# Patient Record
Sex: Male | Born: 2000 | Race: White | Hispanic: No | Marital: Single | Smoking: Never smoker
Health system: Southern US, Community
[De-identification: ages and names within clinical notes are randomized; demographics above are authoritative.]

## PROBLEM LIST (undated history)

## (undated) DIAGNOSIS — Z87820 Personal history of traumatic brain injury: Secondary | ICD-10-CM

## (undated) HISTORY — DX: Personal history of traumatic brain injury: Z87.820

## (undated) HISTORY — PX: OTHER SURGICAL HISTORY: SHX169

---
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
## 2021-04-14 ENCOUNTER — Emergency Department (HOSPITAL_COMMUNITY)
Admission: EM | Admit: 2021-04-14 | Discharge: 2021-04-15 | Disposition: A | Discharge status: Home / Self Care | Payer: 59 | Attending: Emergency Medicine | Admitting: Emergency Medicine

## 2021-04-14 ENCOUNTER — Other Ambulatory Visit: Payer: Self-pay

## 2021-04-14 ENCOUNTER — Encounter (HOSPITAL_COMMUNITY): Payer: Self-pay | Admitting: Emergency Medicine

## 2021-04-14 DIAGNOSIS — S50311A Abrasion of right elbow, initial encounter: Secondary | ICD-10-CM | POA: Insufficient documentation

## 2021-04-14 DIAGNOSIS — S0990XA Unspecified injury of head, initial encounter: Secondary | ICD-10-CM | POA: Diagnosis present

## 2021-04-14 DIAGNOSIS — S060X9A Concussion with loss of consciousness of unspecified duration, initial encounter: Secondary | ICD-10-CM

## 2021-04-14 DIAGNOSIS — Y92410 Unspecified street and highway as the place of occurrence of the external cause: Secondary | ICD-10-CM | POA: Insufficient documentation

## 2021-04-14 DIAGNOSIS — S0083XA Contusion of other part of head, initial encounter: Secondary | ICD-10-CM | POA: Diagnosis not present

## 2021-04-14 DIAGNOSIS — Z23 Encounter for immunization: Secondary | ICD-10-CM | POA: Diagnosis not present

## 2021-04-14 DIAGNOSIS — S50312A Abrasion of left elbow, initial encounter: Secondary | ICD-10-CM | POA: Insufficient documentation

## 2021-04-14 DIAGNOSIS — S80212A Abrasion, left knee, initial encounter: Secondary | ICD-10-CM | POA: Insufficient documentation

## 2021-04-14 DIAGNOSIS — S80211A Abrasion, right knee, initial encounter: Secondary | ICD-10-CM | POA: Insufficient documentation

## 2021-04-14 DIAGNOSIS — M545 Low back pain, unspecified: Secondary | ICD-10-CM | POA: Diagnosis not present
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
## 2021-04-15 ENCOUNTER — Emergency Department (HOSPITAL_COMMUNITY): Payer: 59

## 2021-04-15 ENCOUNTER — Telehealth: Payer: Self-pay | Admitting: *Deleted

## 2021-04-15 LAB — COMPREHENSIVE METABOLIC PANEL
ALT: 18 U/L (ref 0–44)
AST: 26 U/L (ref 15–41)
Albumin: 4.4 g/dL (ref 3.5–5.0)
Alkaline Phosphatase: 63 U/L (ref 38–126)
Anion gap: 8 (ref 5–15)
BUN: 7 mg/dL (ref 6–20)
CO2: 23 mmol/L (ref 22–32)
Calcium: 9 mg/dL (ref 8.9–10.3)
Chloride: 105 mmol/L (ref 98–111)
Creatinine, Ser: 0.98 mg/dL (ref 0.61–1.24)
GFR, Estimated: >60 mL/min (ref >60)
Glucose, Bld: 113 mg/dL — ABNORMAL HIGH (ref 70–99)
Potassium: 3.2 mmol/L — ABNORMAL LOW (ref 3.5–5.1)
Sodium: 136 mmol/L (ref 135–145)
Total Bilirubin: 1.3 mg/dL — ABNORMAL HIGH (ref 0.3–1.2)
Total Protein: 7.3 g/dL (ref 6.5–8.1)

## 2021-04-15 LAB — URINALYSIS, ROUTINE W REFLEX MICROSCOPIC
Bacteria, UA: NONE SEEN
Bilirubin Urine: NEGATIVE
Glucose, UA: NEGATIVE mg/dL
Ketones, ur: NEGATIVE mg/dL
Leukocytes,Ua: NEGATIVE
Nitrite: NEGATIVE
Protein, ur: NEGATIVE mg/dL
Specific Gravity, Urine: 1.006 (ref 1.005–1.030)
pH: 6 (ref 5.0–8.0)

## 2021-04-15 LAB — CBC WITH DIFFERENTIAL/PLATELET
Abs Immature Granulocytes: 0.08 10*3/uL — ABNORMAL HIGH (ref 0.00–0.07)
Basophils Absolute: 0.1 10*3/uL (ref 0.0–0.1)
Basophils Relative: 0 %
Eosinophils Absolute: 0 10*3/uL (ref 0.0–0.5)
Eosinophils Relative: 0 %
HCT: 40.6 % (ref 39.0–52.0)
Hemoglobin: 14.5 g/dL (ref 13.0–17.0)
Immature Granulocytes: 1 %
Lymphocytes Relative: 9 %
Lymphs Abs: 1.5 10*3/uL (ref 0.7–4.0)
MCH: 31.3 pg (ref 26.0–34.0)
MCHC: 35.7 g/dL (ref 30.0–36.0)
MCV: 87.5 fL (ref 80.0–100.0)
Monocytes Absolute: 1.1 10*3/uL — ABNORMAL HIGH (ref 0.1–1.0)
Monocytes Relative: 6 %
Neutro Abs: 14.6 10*3/uL — ABNORMAL HIGH (ref 1.7–7.7)
Neutrophils Relative %: 84 %
Platelets: 238 10*3/uL (ref 150–400)
RBC: 4.64 MIL/uL (ref 4.22–5.81)
RDW: 11.8 % (ref 11.5–15.5)
WBC: 17.3 10*3/uL — ABNORMAL HIGH (ref 4.0–10.5)
nRBC: 0 % (ref 0.0–0.2)

## 2021-04-15 LAB — RAPID URINE DRUG SCREEN, HOSP PERFORMED
Amphetamines: NOT DETECTED
Barbiturates: NOT DETECTED
Benzodiazepines: NOT DETECTED
Cocaine: NOT DETECTED
Opiates: NOT DETECTED
Tetrahydrocannabinol: NOT DETECTED

## 2021-04-15 LAB — ETHANOL: Alcohol, Ethyl (B): <10 mg/dL (ref <10)

## 2021-04-15 MED ORDER — FENTANYL CITRATE (PF) 100 MCG/2ML IJ SOLN
50.0000 ug | Freq: Once | INTRAMUSCULAR | Status: AC
  Administered 2021-04-15: 50 ug via INTRAVENOUS
  Filled 2021-04-15: qty 2

## 2021-04-15 MED ORDER — KETOROLAC TROMETHAMINE 30 MG/ML IJ SOLN
30.0000 mg | Freq: Once | INTRAMUSCULAR | Status: AC
  Administered 2021-04-15: 30 mg via INTRAVENOUS
  Filled 2021-04-15: qty 1

## 2021-04-15 MED ORDER — ACETAMINOPHEN 500 MG PO TABS
1000.0000 mg | ORAL_TABLET | Freq: Once | ORAL | Status: AC
  Administered 2021-04-15: 1000 mg via ORAL
  Filled 2021-04-15: qty 2

## 2021-04-15 MED ORDER — DIPHENHYDRAMINE HCL 50 MG/ML IJ SOLN
25.0000 mg | Freq: Once | INTRAMUSCULAR | Status: AC
  Administered 2021-04-15: 25 mg via INTRAVENOUS
  Filled 2021-04-15: qty 1

## 2021-04-15 MED ORDER — SODIUM CHLORIDE 0.9 % IV BOLUS
1000.0000 mL | Freq: Once | INTRAVENOUS | Status: AC
  Administered 2021-04-15: 1000 mL via INTRAVENOUS

## 2021-04-15 MED ORDER — ONDANSETRON HCL 4 MG/2ML IJ SOLN
4.0000 mg | Freq: Once | INTRAMUSCULAR | Status: AC
  Administered 2021-04-15: 4 mg via INTRAVENOUS
  Filled 2021-04-15: qty 2

## 2021-04-15 MED ORDER — TETANUS-DIPHTH-ACELL PERTUSSIS 5-2.5-18.5 LF-MCG/0.5 IM SUSY
0.5000 mL | PREFILLED_SYRINGE | Freq: Once | INTRAMUSCULAR | Status: AC
  Administered 2021-04-15: 0.5 mL via INTRAMUSCULAR
  Filled 2021-04-15: qty 0.5

## 2021-04-15 MED ORDER — METOCLOPRAMIDE HCL 5 MG/ML IJ SOLN
10.0000 mg | Freq: Once | INTRAMUSCULAR | Status: AC
  Administered 2021-04-15: 10 mg via INTRAVENOUS
  Filled 2021-04-15: qty 2

## 2021-04-15 MED ORDER — BACITRACIN ZINC 500 UNIT/GM EX OINT: 1.0000 "application " | TOPICAL_OINTMENT | Freq: Two times a day (BID) | CUTANEOUS | 0 refills | Status: AC
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
## 2021-04-15 NOTE — Telephone Encounter (Signed)
Pt mom called regarding discharge instructions.  Mom states she was told paperwork would be placed in his patient belongings bag, but it was not (she was instructed to retrieve the car).  RNCM reprinted and placed at front desk for pickup.

## 2021-04-15 NOTE — ED Notes (Signed)
Pt given water and graham crackers, tolerated well

## 2021-04-15 NOTE — Discharge Instructions (Addendum)
1. Medications: Ibuprofen or Tylenol for pain - Recommend 400mg  of Ibuprofen and 500mg  of tylenol at breakfast, lunch and dinner. 2. Treatment: Rest, ice on head.  Concussion precautions given - keep patient in a quiet, not simulating, dark environment. No TV, computer use, video games until headache is resolved completely. No contact sports until cleared by the neurology. 3. Follow Up: With primary care physician on Monday if headache persists.  Return to the emergency department if patient becomes lethargic, begins vomiting, develops double vision, speech difficulty, problems walking or other change in mental status.

## 2021-04-15 NOTE — ED Notes (Signed)
Patient transported to CT 

## 2021-04-18 ENCOUNTER — Encounter: Payer: Self-pay | Admitting: Neurology

## 2021-04-18 ENCOUNTER — Ambulatory Visit (INDEPENDENT_AMBULATORY_CARE_PROVIDER_SITE_OTHER): Payer: 59 | Admitting: Neurology

## 2021-04-18 ENCOUNTER — Encounter: Payer: Self-pay | Admitting: *Deleted

## 2021-04-18 VITALS — BP 118/62 | HR 68 | Ht 71.0 in | Wt 172.0 lb

## 2021-04-18 DIAGNOSIS — S060X1A Concussion with loss of consciousness of 30 minutes or less, initial encounter: Secondary | ICD-10-CM | POA: Insufficient documentation

## 2021-04-18 DIAGNOSIS — R519 Headache, unspecified: Secondary | ICD-10-CM

## 2021-04-18 DIAGNOSIS — S060X1D Concussion with loss of consciousness of 30 minutes or less, subsequent encounter: Secondary | ICD-10-CM

## 2021-04-18 MED ORDER — NORTRIPTYLINE HCL 10 MG PO CAPS: 30.0000 mg | ORAL_CAPSULE | Freq: Every day | ORAL | 11 refills | Status: DC
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
## 2021-04-19 LAB — CBC WITH DIFFERENTIAL/PLATELET
Basophils Absolute: 0.1 10*3/uL (ref 0.0–0.2)
Basos: 1 %
EOS (ABSOLUTE): 0.2 10*3/uL (ref 0.0–0.4)
Eos: 3 %
Hematocrit: 41 % (ref 37.5–51.0)
Hemoglobin: 14.1 g/dL (ref 13.0–17.7)
Immature Grans (Abs): 0 10*3/uL (ref 0.0–0.1)
Immature Granulocytes: 0 %
Lymphocytes Absolute: 1.2 10*3/uL (ref 0.7–3.1)
Lymphs: 20 %
MCH: 31.3 pg (ref 26.6–33.0)
MCHC: 34.4 g/dL (ref 31.5–35.7)
MCV: 91 fL (ref 79–97)
Monocytes Absolute: 0.5 10*3/uL (ref 0.1–0.9)
Monocytes: 8 %
Neutrophils Absolute: 4.1 10*3/uL (ref 1.4–7.0)
Neutrophils: 68 %
Platelets: 244 10*3/uL (ref 150–450)
RBC: 4.51 x10E6/uL (ref 4.14–5.80)
RDW: 12.1 % (ref 11.6–15.4)
WBC: 6.1 10*3/uL (ref 3.4–10.8)

## 2021-04-19 LAB — HGB A1C W/O EAG: Hgb A1c MFr Bld: 4.9 % (ref 4.8–5.6)

## 2021-04-19 LAB — VITAMIN B12: Vitamin B-12: 349 pg/mL (ref 232–1245)

## 2021-04-19 LAB — CK: Total CK: 140 U/L (ref 49–439)

## 2021-04-19 LAB — TSH: TSH: 1.12 u[IU]/mL (ref 0.450–4.500)

## 2021-04-19 LAB — PROLACTIN: Prolactin: 5.9 ng/mL (ref 4.0–15.2)

## 2021-04-23 ENCOUNTER — Telehealth: Payer: Self-pay | Admitting: Neurology

## 2021-04-23 ENCOUNTER — Telehealth: Payer: Self-pay | Admitting: *Deleted

## 2021-04-23 NOTE — Telephone Encounter (Addendum)
MRI brain w/wo contrast [employer] auth: [id number] (exp/ 06/07/21)  Scheduled at [facility] 04/24/21 at 9:30 am

## 2021-04-23 NOTE — Telephone Encounter (Signed)
I spoke to the patient's mother on DPR. She was notified of his lab results. She understands to expect a call to get his MRI brain and EEG scheduled. Also, cardiology will be reaching out to make an appt.

## 2021-04-23 NOTE — Telephone Encounter (Signed)
Adam Shook, RN  Brennan Bailey I spoke to patient's mother on [place]. She was inquiring about his MRI brain, EEG and cardiology referral. I informed her we were closed Friday and Monday. She understands to expect a call to get these things scheduled. Thanks.  Per Jillian's note in patient's chart, Berkley Harvey has been obtained for MRI brain. Patient will be called to schedule this at our office.  MRI brain w/wo contrast [employer] auth: [id number] (exp/ 06/07/21)   EEG will be scheduled when we have figured out the EEG tech situation.   Cardiology referral was sent to [facility]. They will call patient to schedule. Phone: [phone]. Address: [address], [place], [place] [address].

## 2021-04-24 ENCOUNTER — Ambulatory Visit: Payer: 59

## 2021-04-24 DIAGNOSIS — S060X1D Concussion with loss of consciousness of 30 minutes or less, subsequent encounter: Secondary | ICD-10-CM | POA: Diagnosis not present

## 2021-04-24 DIAGNOSIS — R519 Headache, unspecified: Secondary | ICD-10-CM | POA: Diagnosis not present

## 2021-04-24 MED ORDER — GADOBENATE DIMEGLUMINE 529 MG/ML IV SOLN
15.0000 mL | Freq: Once | INTRAVENOUS | Status: AC | PRN
  Administered 2021-04-24: 15 mL via INTRAVENOUS

## 2021-04-29 ENCOUNTER — Telehealth: Payer: Self-pay | Admitting: *Deleted

## 2021-04-29 NOTE — Telephone Encounter (Signed)
I spoke to the patient's mother on DPR and provided her with the MRI brain results. She will relay this information to the patient.

## 2021-04-29 NOTE — Telephone Encounter (Signed)
-----   Message from Levert Feinstein, MD sent at 04/29/2021 11:22 AM EDT ----- Patient aware.  Please call patient for normal MRI of the brain

## 2021-04-30 ENCOUNTER — Other Ambulatory Visit: Payer: Self-pay

## 2021-04-30 ENCOUNTER — Ambulatory Visit (HOSPITAL_COMMUNITY)
Admission: RE | Admit: 2021-04-30 | Discharge: 2021-04-30 | Disposition: A | Discharge status: Home / Self Care | Payer: 59 | Source: Ambulatory Visit | Attending: Neurology | Admitting: Neurology

## 2021-04-30 DIAGNOSIS — R519 Headache, unspecified: Secondary | ICD-10-CM | POA: Insufficient documentation

## 2021-04-30 DIAGNOSIS — S060X1D Concussion with loss of consciousness of 30 minutes or less, subsequent encounter: Secondary | ICD-10-CM | POA: Insufficient documentation

## 2021-04-30 DIAGNOSIS — G478 Other sleep disorders: Secondary | ICD-10-CM

## 2021-04-30 NOTE — Progress Notes (Signed)
EEG Completed; Results Pending  
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
## 2021-05-01 ENCOUNTER — Telehealth: Payer: Self-pay

## 2021-05-01 NOTE — Telephone Encounter (Signed)
Pt verified by name and DOB,  normal results given per provider, pt voiced understanding all question answered. °

## 2021-05-01 NOTE — Progress Notes (Signed)
Please call patient for normal EEG 

## 2021-05-01 NOTE — Telephone Encounter (Signed)
-----   Message from Levert Feinstein, MD sent at 05/01/2021  8:01 AM EDT ----- Please call patient for normal EEG.

## 2021-05-06 ENCOUNTER — Telehealth: Payer: Self-pay | Admitting: Neurology

## 2021-05-06 NOTE — Telephone Encounter (Signed)
Pt called has some questions about his driving restrictions, says he has had his MRI & EEG and they were both normal. Pt requesting a call back.

## 2021-05-07 ENCOUNTER — Telehealth: Payer: Self-pay | Admitting: Neurology

## 2021-05-07 NOTE — Telephone Encounter (Signed)
I spoke to the patient and his mother. He is only taking nortriptyline 10mg  at bedtime. He is no longer having any headaches. Says he feels this is more related to the healing process post-concussion and he prefers to not take any medication. He would like to stop taking nortriptyline. He understanding he may restart, as prescribed, if headaches return.

## 2021-05-07 NOTE — Telephone Encounter (Signed)
Pt called, asking if need to continue taking nortriptyline (PAMELOR) 10 MG capsule. Would like a call from the nurse.

## 2021-06-25 ENCOUNTER — Encounter: Payer: Self-pay | Admitting: Cardiology

## 2021-06-25 ENCOUNTER — Other Ambulatory Visit: Payer: Self-pay

## 2021-06-25 ENCOUNTER — Ambulatory Visit: Payer: 59 | Admitting: Cardiology

## 2021-06-25 VITALS — BP 116/69 | HR 83 | Ht 71.0 in | Wt 167.6 lb

## 2021-06-25 DIAGNOSIS — R55 Syncope and collapse: Secondary | ICD-10-CM

## 2021-06-25 MED ORDER — METOPROLOL TARTRATE 100 MG PO TABS: 100.0000 mg | ORAL_TABLET | Freq: Once | ORAL | 0 refills | Status: AC

## 2021-06-25 NOTE — Patient Instructions (Addendum)
Medication Instructions:  Your physician recommends that you continue on your current medications as directed. Please refer to the Current Medication list given to you today.  *If you need a refill on your cardiac medications before your next appointment, please call your pharmacy*   Lab Work: TODAY: BMET If you have labs (blood work) drawn today and your tests are completely normal, you will receive your results only by: MyChart Message (if you have MyChart) OR A paper copy in the mail If you have any lab test that is abnormal or we need to change your treatment, we will call you to review the results.   Testing/Procedures: Your physician has requested that you have an echocardiogram. Echocardiography is a painless test that uses sound waves to create images of your heart. It provides your doctor with information about the size and shape of your heart and how well your heart's chambers and valves are working. This procedure takes approximately one hour. There are no restrictions for this procedure.  Your physician has requested that you have a coronary CTA scan. Please see below for further instructions.    Follow-Up: At [facility], you and your health needs are our priority.  As part of our continuing mission to provide you with exceptional heart care, we have created designated Provider Care Teams.  These Care Teams include your primary Cardiologist (physician) and Advanced Practice Providers (APPs -  Physician Assistants and Nurse Practitioners) who all work together to provide you with the care you need, when you need it.  Follow up with Dr. Mayford Knife as needed based on results of testing.   You have been referred to see an Electrophysiologist (EP)   Other Instructions   Your cardiac CT will be scheduled at:   [facility] [address] [place], [place] [address] [phone]  Please arrive at the [facility] main entrance (entrance A) of [facility]  30 minutes prior to test start time. Proceed to the [facility] Radiology Department (first floor) to check-in and test prep.  Please follow these instructions carefully (unless otherwise directed):  On the Night Before the Test: Be sure to Drink plenty of water. Do not consume any caffeinated/decaffeinated beverages or chocolate 12 hours prior to your test. Do not take any antihistamines 12 hours prior to your test.   On the Day of the Test: Drink plenty of water until 1 hour prior to the test. Do not eat any food 4 hours prior to the test. You may take your regular medications prior to the test.  Take metoprolol (Lopressor) two hours prior to test.      After the Test: Drink plenty of water. After receiving IV contrast, you may experience a mild flushed feeling. This is normal. On occasion, you may experience a mild rash up to 24 hours after the test. This is not dangerous. If this occurs, you can take Benadryl 25 mg and increase your fluid intake. If you experience trouble breathing, this can be serious. If it is severe call 911 IMMEDIATELY. If it is mild, please call our office.  Please allow 2-4 weeks for scheduling of routine cardiac CTs. Some insurance companies require a pre-authorization which may delay scheduling of this test.   For non-scheduling related questions, please contact the cardiac imaging nurse navigator should you have any questions/concerns: Rockwell Alexandria, Cardiac Imaging Nurse Navigator Larey Brick, Cardiac Imaging Nurse Navigator [facility] Direct Office Dial: [phone]   For scheduling needs,  including cancellations and rescheduling, please call [place], [phone].
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
## 2021-06-26 LAB — BASIC METABOLIC PANEL
BUN/Creatinine Ratio: 9 (ref 9–20)
BUN: 9 mg/dL (ref 6–20)
CO2: 26 mmol/L (ref 20–29)
Calcium: 9.3 mg/dL (ref 8.7–10.2)
Chloride: 101 mmol/L (ref 96–106)
Creatinine, Ser: 0.99 mg/dL (ref 0.76–1.27)
Glucose: 76 mg/dL (ref 65–99)
Potassium: 4.2 mmol/L (ref 3.5–5.2)
Sodium: 140 mmol/L (ref 134–144)
eGFR: 112 mL/min/{1.73_m2} (ref >59)

## 2021-07-01 ENCOUNTER — Telehealth (HOSPITAL_COMMUNITY): Payer: Self-pay | Admitting: *Deleted

## 2021-07-01 NOTE — Telephone Encounter (Signed)
Attempted to call patient regarding upcoming cardiac CT appointment. °Left message on voicemail with name and callback number ° °Ladrea Holladay RN Navigator Cardiac Imaging °[facility] °[phone] Office °[phone] Cell ° °

## 2021-07-03 ENCOUNTER — Ambulatory Visit (HOSPITAL_COMMUNITY)
Admission: RE | Admit: 2021-07-03 | Discharge: 2021-07-03 | Disposition: A | Discharge status: Home / Self Care | Payer: 59 | Source: Ambulatory Visit | Attending: Cardiology | Admitting: Cardiology

## 2021-07-03 ENCOUNTER — Encounter (HOSPITAL_COMMUNITY): Payer: Self-pay

## 2021-07-03 ENCOUNTER — Other Ambulatory Visit: Payer: Self-pay

## 2021-07-03 DIAGNOSIS — R55 Syncope and collapse: Secondary | ICD-10-CM | POA: Insufficient documentation

## 2021-07-03 MED ORDER — NITROGLYCERIN 0.4 MG SL SUBL
0.8000 mg | SUBLINGUAL_TABLET | Freq: Once | SUBLINGUAL | Status: AC
  Administered 2021-07-03: 0.8 mg via SUBLINGUAL

## 2021-07-03 MED ORDER — NITROGLYCERIN 0.4 MG SL SUBL
SUBLINGUAL_TABLET | SUBLINGUAL | Status: AC
  Filled 2021-07-03: qty 2

## 2021-07-03 MED ORDER — IOHEXOL 350 MG/ML SOLN
80.0000 mL | Freq: Once | INTRAVENOUS | Status: AC | PRN
  Administered 2021-07-03: 80 mL via INTRAVENOUS

## 2021-07-16 ENCOUNTER — Other Ambulatory Visit: Payer: Self-pay

## 2021-07-16 ENCOUNTER — Ambulatory Visit (HOSPITAL_COMMUNITY): Payer: 59 | Attending: Internal Medicine

## 2021-07-16 DIAGNOSIS — R55 Syncope and collapse: Secondary | ICD-10-CM | POA: Diagnosis not present

## 2021-07-16 LAB — ECHOCARDIOGRAM COMPLETE
Area-P 1/2: 3.12 cm2
S' Lateral: 3.2 cm

## 2021-07-24 ENCOUNTER — Institutional Professional Consult (permissible substitution) (HOSPITAL_BASED_OUTPATIENT_CLINIC_OR_DEPARTMENT_OTHER): Payer: 59 | Admitting: Internal Medicine

## 2021-08-14 ENCOUNTER — Institutional Professional Consult (permissible substitution): Payer: 59 | Admitting: Internal Medicine

## 2021-08-14 DIAGNOSIS — R55 Syncope and collapse: Secondary | ICD-10-CM

## 2021-09-26 ENCOUNTER — Ambulatory Visit: Payer: Self-pay | Admitting: Neurology

## 2021-09-26 ENCOUNTER — Encounter: Payer: Self-pay | Admitting: Neurology

## 2022-03-15 IMAGING — CT CT HEART MORP W/ CTA COR W/ SCORE W/ CA W/CM &/OR W/O CM
4 of 7 series · 8 of 20 positions shown, 9 images · IV contrast (APPLIED)
Comparison: None.
COMPARISON: None.

Addendum:
EXAM:
OVER-READ INTERPRETATION  CT CHEST

The following report is an over-read performed by radiologist Dr.
Jose Ascencion Bv [REDACTED] on 07/03/2021. This
over-read does not include interpretation of cardiac or coronary
anatomy or pathology. The coronary CTA interpretation by the
cardiologist is attached.
CLINICAL DATA: Chest pain
Cardiac CTA
MEDICATIONS:
Sub lingual nitro. 4mg x 2
TECHNIQUE: The patient was scanned on a Siemens [REDACTED]ice scanner. Gantry
rotation speed was 250 msecs. Collimation was 0.6 mm. A 100 kV
prospective scan was triggered in the ascending thoracic aorta at
35-75% of the R-R interval. Average HR during the scan was 60 bpm.
The 3D data set was interpreted on a dedicated work station using
MPR, MIP and VRT modes. A total of 80cc of contrast was used.

[Series 6: best diast 77 % · axial · 0.36mm/px · z∈[+1141,+1188]mm · 2 of 351 slices shown]
[im 117/351  vessel]
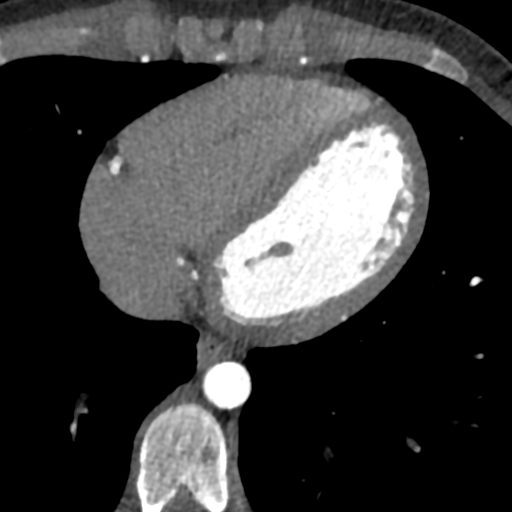
[im 234/351  vessel]
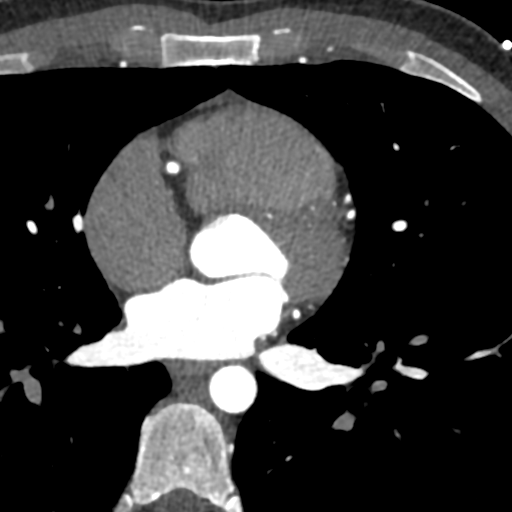

[Series 7: best syst · axial · 0.36mm/px · z∈[+1141,+1188]mm · 2 of 351 slices shown, 3 images]
[im 117/351  vessel]
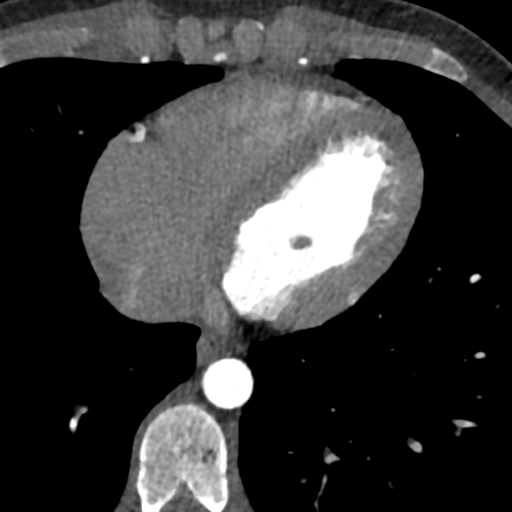
[im 117/351  lung]
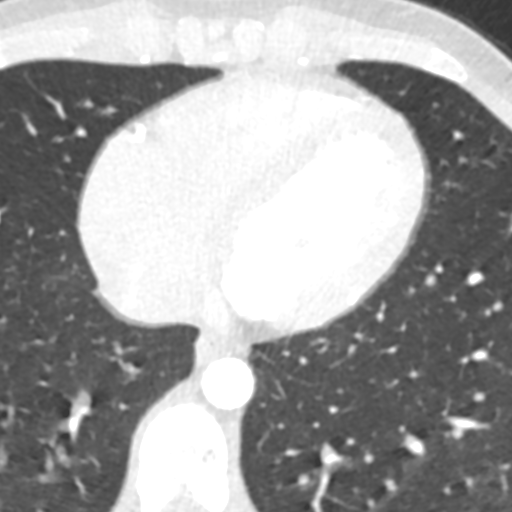
[im 234/351  vessel]
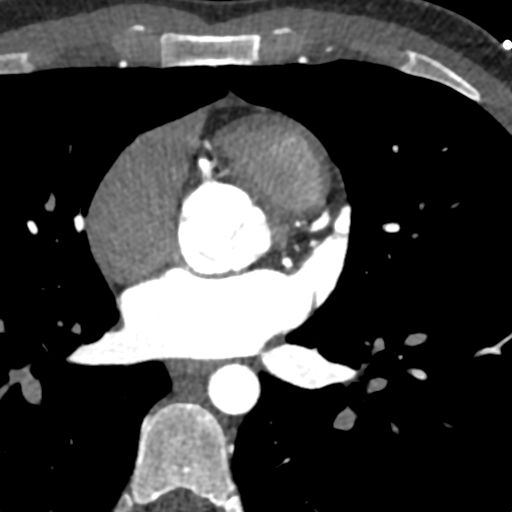

[Series 8: ts diast sharp 77 % · axial · 0.36mm/px · z∈[+1141,+1188]mm · 2 of 351 slices shown]
[im 117/351  lung]
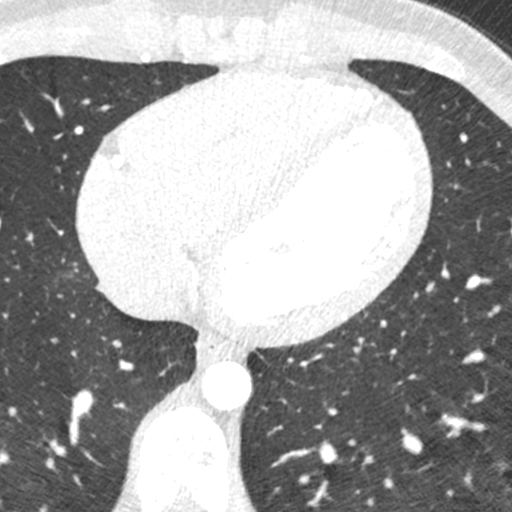
[im 234/351  lung]
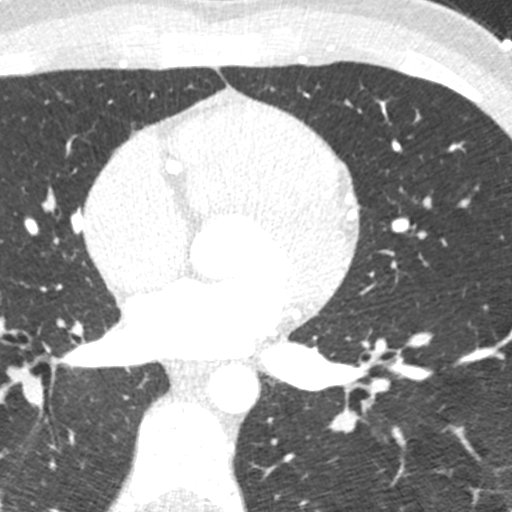

[Series 9: ts syst sharp 36 % · axial · 0.36mm/px · z∈[+1141,+1188]mm · 2 of 351 slices shown]
[im 117/351  lung]
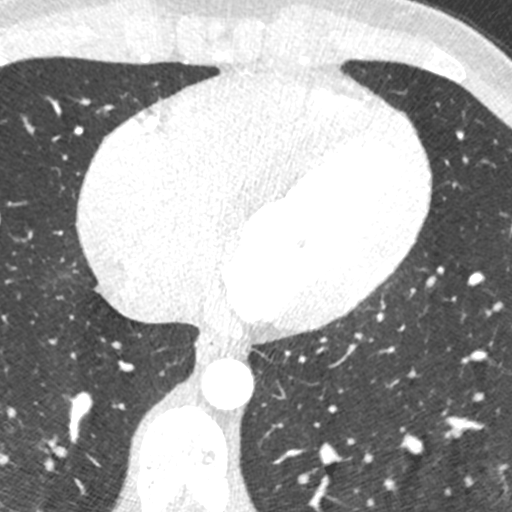
[im 234/351  lung]
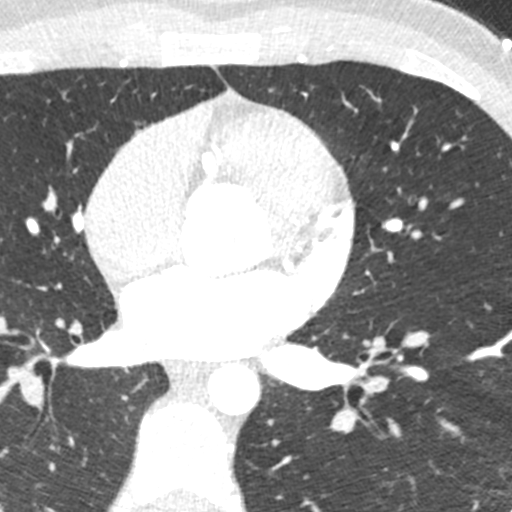

[8 of 20 positions shown; findings below may reference images not displayed]

FINDINGS: Vascular: No significant noncardiac vascular findings.

Mediastinum/Nodes: Visualized mediastinum and hilar regions are
unremarkable and demonstrate no lymphadenopathy or masses. Remnant
thymic tissue is partially visualized in the anterior mediastinum.

Lungs/Pleura: There is no evidence of pulmonary edema,
consolidation, pneumothorax, nodule or pleural fluid.

Upper Abdomen: No acute abnormality.

Musculoskeletal: No chest wall mass or suspicious bone lesions
identified.
IMPRESSION: No significant incidental findings.
FINDINGS: Non-cardiac: See separate report from [REDACTED].

Pulmonary veins drain normally to the left atrium. No LA appendage
thrombus.

Calcium Score: 0 Agatston units.

Coronary Arteries: Right dominant with no anomalies

LM: No plaque or stenosis.

LAD system:  No plaque or stenosis.

Circumflex system: No plaque or stenosis.

RCA system:  No plaque or stenosis.
IMPRESSION: 1. Coronary artery calcium score 0 Agatston units, suggesting low
risk for future cardiac events.

2.  No significant coronary disease noted.

Gorge Luis Melchor

*** End of Addendum ***
EXAM:
OVER-READ INTERPRETATION  CT CHEST

The following report is an over-read performed by radiologist Dr.
Jose Ascencion Bv [REDACTED] on 07/03/2021. This
over-read does not include interpretation of cardiac or coronary
anatomy or pathology. The coronary CTA interpretation by the
cardiologist is attached.
FINDINGS: Vascular: No significant noncardiac vascular findings.

Mediastinum/Nodes: Visualized mediastinum and hilar regions are
unremarkable and demonstrate no lymphadenopathy or masses. Remnant
thymic tissue is partially visualized in the anterior mediastinum.

Lungs/Pleura: There is no evidence of pulmonary edema,
consolidation, pneumothorax, nodule or pleural fluid.

Upper Abdomen: No acute abnormality.

Musculoskeletal: No chest wall mass or suspicious bone lesions
identified.
IMPRESSION: No significant incidental findings.
